# Patient Record
Sex: Male | Born: 1995 | Race: Black or African American | Hispanic: No | Marital: Single | Smoking: Never smoker
Health system: Southern US, Community
[De-identification: ages and names within clinical notes are randomized; demographics above are authoritative.]

## PROBLEM LIST (undated history)

## (undated) ENCOUNTER — Emergency Department (HOSPITAL_BASED_OUTPATIENT_CLINIC_OR_DEPARTMENT_OTHER): Payer: Self-pay | Source: Home / Self Care

---
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
## 2014-04-30 ENCOUNTER — Encounter (HOSPITAL_COMMUNITY): Payer: Self-pay | Admitting: Emergency Medicine

## 2014-04-30 ENCOUNTER — Emergency Department (HOSPITAL_COMMUNITY)
Admission: EM | Admit: 2014-04-30 | Discharge: 2014-05-01 | Disposition: A | Discharge status: Home / Self Care | Payer: BC Managed Care – PPO | Attending: Emergency Medicine | Admitting: Emergency Medicine

## 2014-04-30 DIAGNOSIS — X58XXXS Exposure to other specified factors, sequela: Secondary | ICD-10-CM | POA: Insufficient documentation

## 2014-04-30 DIAGNOSIS — IMO0002 Reserved for concepts with insufficient information to code with codable children: Secondary | ICD-10-CM | POA: Insufficient documentation

## 2014-04-30 DIAGNOSIS — S6990XA Unspecified injury of unspecified wrist, hand and finger(s), initial encounter: Secondary | ICD-10-CM

## 2014-04-30 NOTE — Discharge Instructions (Signed)

## 2014-04-30 NOTE — ED Notes (Signed)
Patient here with complaint of knife injury to right thumb. States he penetrated right thumb with "filet knife used to skin animals". States wound had closed but recently reopened. No drainage endorsed or noted.

## 2014-04-30 NOTE — ED Provider Notes (Signed)
CSN: [id number]     Arrival date & time 04/30/14  2254 History  This chart was scribed for non-physician practitioner working with No att. providers found by Elveria Rising, ED Scribe. This patient was seen in room TR08C/TR08C and the patient's care was started at 11:30 PM.   Chief Complaint  Patient presents with  . Finger Injury     HPI HPI Comments: Brandon Beltran is a 18 y.o. male who presents to the Emergency Department with right thumb injury, that occurred five days ago. Patient reports lacerating right thumb with filet knife and now has laceration to the pad of his right thumb. Patient was not evaluated after the injury. He instead treated the wound with salt, salt water and ice water. Patient visited tonight ED because the wound reopened today after it had began healing.it did not bleed.  Patient denies drainage. No active bleeding.  Tetanus vaccination is UTD.     History reviewed. No pertinent past medical history. History reviewed. No pertinent past surgical history. History reviewed. No pertinent family history. History  Substance Use Topics  . Smoking status: Never Smoker   . Smokeless tobacco: Not on file  . Alcohol Use: No    Review of Systems  Constitutional: Negative for fever.  Skin:       Laceration  All other systems reviewed and are negative.     Allergies  Review of patient's allergies indicates no known allergies.  Home Medications   Prior to Admission medications   Not on File   Triage Vitals: BP 137/83  Pulse 88  Temp(Src) 98.1 F (36.7 C) (Oral)  Resp 20  Ht 6' (1.829 m)  Wt 166 lb (75.297 kg)  BMI 22.51 kg/m2  SpO2 97% Physical Exam  Nursing note and vitals reviewed. Constitutional: He is oriented to person, place, and time. He appears well-developed and well-nourished. No distress.  HENT:  Head: Normocephalic and atraumatic.  Eyes: EOM are normal.  Neck: Neck supple. No tracheal deviation present.  Cardiovascular: Normal rate.    Pulmonary/Chest: Effort normal. No respiratory distress.  Musculoskeletal: Normal range of motion.  Right thumb: Partially healed laceration to lateral, distal phalange. No signs of infection. No bleeding.   Neurological: He is alert and oriented to person, place, and time.  Skin: Skin is warm and dry.  Psychiatric: He has a normal mood and affect. His behavior is normal.    ED Course  Procedures (including critical care time) DIAGNOSTIC STUDIES: Oxygen Saturation is 97% on room air, normal by my interpretation.    COORDINATION OF CARE: 11:31 PM- Plans to clean wound. Patient advised to clean thumb with  Discussed treatment plan with patient at bedside and patient agreed to plan. Too late to suture wound, it will heal by second intent. Advised to keep clean and dry. The laceration is very tiny at 0.5 cm wide and due to it being a puncture wound, cannot appreciate how deep. No loss of function of finger and no loss of sensation or deformity.   Labs Review Labs Reviewed - No data to display  Imaging Review No results found.   EKG Interpretation None      MDM   Final diagnoses:  Finger injury    17 y.o.Jah L Gorden's evaluation in the Emergency Department is complete. It has been determined that no acute conditions requiring further emergency intervention are present at this time. The patient/guardian have been advised of the diagnosis and plan. We have discussed signs and symptoms that  warrant return to the ED, such as changes or worsening in symptoms.  Vital signs are stable at discharge. Filed Vitals:   04/30/14 2304  BP: 137/83  Pulse: 88  Temp: 98.1 F (36.7 C)  Resp: 20    Patient/guardian has voiced understanding and agreed to follow-up with the PCP or specialist.  I personally performed the services described in this documentation, which was scribed in my presence. The recorded information has been reviewed and is accurate.   Dorthula Matasiffany G Ellory Khurana,  PA-C 05/01/14 2048  Dorthula Matasiffany G Zakiyyah [place], PA-C 05/01/14 2049

## 2014-05-02 NOTE — ED Provider Notes (Signed)
Medical screening examination/treatment/procedure(s) were performed by non-physician practitioner and as supervising physician I was immediately available for consultation/collaboration.   EKG Interpretation None        Vanetta Mulders, MD 05/02/14 1040
